# Patient Record
Sex: Male | Born: 1980 | Race: Black or African American | Hispanic: No | Marital: Single | Smoking: Current every day smoker
Health system: Southern US, Community
[De-identification: ages and names within clinical notes are randomized; demographics above are authoritative.]

---

## 2005-10-16 ENCOUNTER — Inpatient Hospital Stay: Payer: Self-pay | Admitting: Internal Medicine

## 2008-06-05 ENCOUNTER — Emergency Department: Payer: Self-pay | Admitting: Emergency Medicine

## 2008-06-06 ENCOUNTER — Emergency Department: Payer: Self-pay | Admitting: Emergency Medicine

## 2013-06-09 ENCOUNTER — Emergency Department: Payer: Self-pay | Admitting: Emergency Medicine
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
## 2016-05-01 ENCOUNTER — Emergency Department
Admission: EM | Admit: 2016-05-01 | Discharge: 2016-05-01 | Disposition: A | Discharge status: Home / Self Care | Payer: Self-pay | Attending: Student in an Organized Health Care Education/Training Program | Admitting: Student in an Organized Health Care Education/Training Program

## 2016-05-01 ENCOUNTER — Encounter: Payer: Self-pay | Admitting: Emergency Medicine

## 2016-05-01 DIAGNOSIS — F17210 Nicotine dependence, cigarettes, uncomplicated: Secondary | ICD-10-CM | POA: Insufficient documentation

## 2016-05-01 DIAGNOSIS — Y999 Unspecified external cause status: Secondary | ICD-10-CM | POA: Insufficient documentation

## 2016-05-01 DIAGNOSIS — Y9389 Activity, other specified: Secondary | ICD-10-CM | POA: Insufficient documentation

## 2016-05-01 DIAGNOSIS — Y9289 Other specified places as the place of occurrence of the external cause: Secondary | ICD-10-CM | POA: Insufficient documentation

## 2016-05-01 DIAGNOSIS — S0083XA Contusion of other part of head, initial encounter: Secondary | ICD-10-CM | POA: Insufficient documentation

## 2016-05-01 DIAGNOSIS — T148XXA Other injury of unspecified body region, initial encounter: Secondary | ICD-10-CM

## 2016-05-01 DIAGNOSIS — F1010 Alcohol abuse, uncomplicated: Secondary | ICD-10-CM | POA: Insufficient documentation
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
## 2016-05-01 NOTE — ED Notes (Signed)
Officers removed handcuffs to wrists for vital sign assessment. Remain off when patient transported to room.
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
## 2016-05-01 NOTE — Discharge Instructions (Signed)
Please seek medical care should he have worsening headache, pain, visual disturbance, nausea or vomiting.

## 2023-03-03 ENCOUNTER — Ambulatory Visit: Payer: 59 | Admitting: Physician Assistant

## 2023-03-08 ENCOUNTER — Ambulatory Visit (INDEPENDENT_AMBULATORY_CARE_PROVIDER_SITE_OTHER): Payer: 59 | Admitting: Family Medicine

## 2023-03-08 ENCOUNTER — Encounter: Payer: Self-pay | Admitting: Family Medicine

## 2023-03-08 VITALS — BP 137/77 | HR 85 | Ht 70.0 in | Wt 207.0 lb

## 2023-03-08 DIAGNOSIS — R030 Elevated blood-pressure reading, without diagnosis of hypertension: Secondary | ICD-10-CM

## 2023-03-08 DIAGNOSIS — F17200 Nicotine dependence, unspecified, uncomplicated: Secondary | ICD-10-CM | POA: Diagnosis not present

## 2023-03-08 DIAGNOSIS — Z7689 Persons encountering health services in other specified circumstances: Secondary | ICD-10-CM

## 2023-03-08 DIAGNOSIS — N528 Other male erectile dysfunction: Secondary | ICD-10-CM

## 2023-03-08 DIAGNOSIS — F419 Anxiety disorder, unspecified: Secondary | ICD-10-CM

## 2023-03-08 DIAGNOSIS — Z716 Tobacco abuse counseling: Secondary | ICD-10-CM

## 2023-03-08 DIAGNOSIS — Z0000 Encounter for general adult medical examination without abnormal findings: Secondary | ICD-10-CM

## 2023-03-08 MED ORDER — BUPROPION HCL ER (SR) 150 MG PO TB12
ORAL_TABLET | ORAL | 1 refills | Status: DC
Start: 2023-03-08 — End: 2023-05-23

## 2023-03-08 MED ORDER — NICOTINE 14 MG/24HR TD PT24
14.0000 mg | MEDICATED_PATCH | Freq: Every day | TRANSDERMAL | 0 refills | Status: AC
Start: 2023-03-08 — End: ?

## 2023-03-08 MED ORDER — NICOTINE POLACRILEX 4 MG MT GUM
4.0000 mg | CHEWING_GUM | OROMUCOSAL | 0 refills | Status: AC | PRN
Start: 2023-03-08 — End: ?

## 2023-03-08 NOTE — Progress Notes (Signed)
New patient visit   Patient: Nicholas Higgins   DOB: [birth year]   42 y.o. Male  MRN: [id number] Visit Date: 03/08/2023  Today's healthcare provider: [name], DO   Chief Complaint  Patient presents with   New Patient (Initial Visit)   Subjective    Nicholas Higgins is a 42 y.o. male who presents today as a new patient to establish care.  HPI   Patient states that he does not believe he has any concerns today.  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  - He states he does them as he believes it is easier than asking them to do it  Not drinking anymore. Still smoking; wants to stop.    History reviewed. No pertinent past medical history. History reviewed. No pertinent surgical history. Family Status  Relation Name Status   Mother  Alive   Father  Alive  No partnership data on file   Family History  Problem Relation Age of Onset   Hypertension Mother    Social History   Socioeconomic History   Marital status: Single    Spouse name: Not on file   Number of children: Not on file   Years of education: Not on file   Highest education level: Not on file  Occupational History   Not on file  Tobacco Use   Smoking status: Every Day    Current packs/day: 0.50    Types: Cigarettes   Smokeless tobacco: Never  Substance and Sexual Activity   Alcohol use: Not Currently   Drug use: Not Currently   Sexual activity: Not on file  Other Topics Concern   Not on file  Social History Narrative   Not on file   Social Determinants of Health   Financial Resource Strain: Not on file  Food Insecurity: Not on file  Transportation Needs: Not on file   Physical Activity: Not on file  Stress: Not on file  Social Connections: Not on file   No outpatient medications prior to visit.   No facility-administered medications prior to visit.   No Known Allergies   There is no immunization history on file for this patient.  Health Maintenance  Topic Date Due   HIV Screening  Never done   Hepatitis C Screening  Never done   DTaP/Tdap/Td (1 - Tdap) Never done   COVID-19 Vaccine (1 - 2023-24 season) Never done   INFLUENZA VACCINE  03/30/2023   HPV VACCINES  Aged Out    Patient Care Team: Imanie Darrow, Monico Blitz, DO as PCP - General (Family Medicine)  Review of Systems  Constitutional:  Negative for appetite change, chills, fatigue and fever.  HENT:  Negative for congestion, ear pain, hearing loss, nosebleeds, sore throat and trouble swallowing.   Eyes:  Negative for pain and visual disturbance.  Respiratory:  Negative for cough, chest tightness and shortness of breath.   Cardiovascular:  Negative for chest pain, palpitations and leg swelling.  Gastrointestinal:  Negative for abdominal pain, blood in stool, constipation, diarrhea, nausea and vomiting.  Endocrine: Negative for polydipsia, polyphagia and polyuria.  Genitourinary:  Negative for difficulty urinating, dysuria, flank pain,  frequency, hematuria and urgency.  Musculoskeletal:  Negative for arthralgias, back pain, joint swelling, myalgias and neck stiffness.  Skin:  Negative for color change, rash and wound.  Neurological:  Negative for dizziness, tremors, seizures, speech difficulty, weakness, light-headedness, numbness and headaches.  Psychiatric/Behavioral:  Negative for behavioral problems, confusion, decreased concentration, dysphoric mood and sleep disturbance. The patient is nervous/anxious.   All other systems reviewed and are negative.      Objective    BP 137/77   Pulse 85   Ht 5\' 10"  (1.778 m)   Wt 207 lb (93.9 kg)   SpO2 97%   BMI 29.70 kg/m    Physical  Exam Vitals reviewed.  Constitutional:      General: He is not in acute distress.    Appearance: Normal appearance. He is well-developed. He is not diaphoretic.  HENT:     Head: Normocephalic and atraumatic.     Right Ear: Tympanic membrane, ear canal and external ear normal.     Left Ear: Tympanic membrane, ear canal and external ear normal.     Nose: Nose normal.     Mouth/Throat:     Mouth: Mucous membranes are moist.     Pharynx: Oropharynx is clear. No oropharyngeal exudate.  Eyes:     General: No scleral icterus.    Conjunctiva/sclera: Conjunctivae normal.     Pupils: Pupils are equal, round, and reactive to light.  Neck:     Thyroid: No thyromegaly.  Cardiovascular:     Rate and Rhythm: Normal rate and regular rhythm.     Pulses: Normal pulses.     Heart sounds: Normal heart sounds. No murmur heard. Pulmonary:     Effort: Pulmonary effort is normal. No respiratory distress.     Breath sounds: Normal breath sounds. No wheezing or rales.  Abdominal:     General: There is no distension.     Palpations: Abdomen is soft.     Tenderness: There is no abdominal tenderness.  Musculoskeletal:        General: No deformity.     Cervical back: Neck supple.     Right lower leg: No edema.     Left lower leg: No edema.  Lymphadenopathy:     Cervical: No cervical adenopathy.  Skin:    General: Skin is warm and dry.     Findings: No rash.  Neurological:     Mental Status: He is alert and oriented to person, place, and time. Mental status is at baseline.     Gait: Gait normal.  Psychiatric:        Mood and Affect: Mood normal.        Behavior: Behavior normal.        Thought Content: Thought content normal. Thought content does not include homicidal or suicidal ideation. Thought content does not include homicidal or suicidal plan.     Depression Screen    03/08/2023    1:35 PM  PHQ 2/9 Scores  PHQ - 2 Score 0  PHQ- 9 Score 0   No results found for any visits on  03/08/23.  Assessment & Plan     1. Establishing care with new doctor, encounter for  2. Annual physical exam Physical exam benign overall.  Patient will be getting labs done while fasting on a different day.  Will also screen for HIV/HCV as he has never been screened before to his knowledge. - Comprehensive metabolic panel - Lipid panel - HIV Antibody (routine testing w rflx) -  HCV Ab w Reflex to Quant PCR - CBC With Differential - TSH Rfx on Abnormal to Free T4  3. Nicotine dependence with current use Patient does want to stop smoking.  Will be prescribing bupropion as well as a moderate dose patch as noted below.  Also prescribed Nicorette gum for patient to use when he gets an inclination to smoke.  Cautioned him against using too many along with the Nicorette patch, as it would potentially increase his nicotine dose if he does so.  Also discussed that the gum is not a true gum but that it needs to be chewed and then put next to his gums and held there rather than consistently chewed.  Referred to smoking cessation program.  Also gave him 1 800 quit now hotline number to facilitate access to alternative smoking cessation programs. - Ambulatory referral to Smoking Cessation Program - nicotine (NICODERM CQ - DOSED IN MG/24 HOURS) 14 mg/24hr patch; Place 1 patch (14 mg total) onto the skin daily.  Dispense: 28 patch; Refill: 0 - nicotine polacrilex (NICORETTE) 4 MG gum; Take 1 each (4 mg total) by mouth as needed for smoking cessation.  Dispense: 100 tablet; Refill: 0 - buPROPion (WELLBUTRIN SR) 150 MG 12 hr tablet; 150 mg orally once daily in the morning for 3 days, then increase to 150 mg orally twice daily, with at least 8 hours between doses; MAX, 300 mg/day  Dispense: 60 tablet; Refill: 1  4. Encounter for smoking cessation counseling - Ambulatory referral to Smoking Cessation Program - buPROPion (WELLBUTRIN SR) 150 MG 12 hr tablet; 150 mg orally once daily in the morning for 3 days,  then increase to 150 mg orally twice daily, with at least 8 hours between doses; MAX, 300 mg/day  Dispense: 60 tablet; Refill: 1  5. Elevated blood pressure reading without diagnosis of hypertension Patient has a mildly elevated blood pressure without a previous diagnosis of hypertension.  Patient states he is able to check his blood pressure at his mother's home nearby and will do so and bring a log on his next visit.  Will plan to recheck on follow-up.  6. Moderate anxiety GAD-7 score: 10.  Discussed options for addressing the patient's anxiety.  Patient is willing to try bupropion as listed below to address both his anxiety and his desire to quit smoking.  Will plan to have him follow-up in 4 weeks for recheck.  He is not willing to try therapy at this time. - buPROPion (WELLBUTRIN SR) 150 MG 12 hr tablet; 150 mg orally once daily in the morning for 3 days, then increase to 150 mg orally twice daily, with at least 8 hours between doses; MAX, 300 mg/day  Dispense: 60 tablet; Refill: 1  7. Mixed erectile dysfunction Patient does endorse issues with erectile dysfunction and scored low in the orgasmic function and sexual desire domains of the International index of erectile function questionnaire.  Will check androgen, prolactin and testosterone levels as noted below, as well as refer him to urology IIEF-15 domain scores: A. 16; B. 6; C. 5; D. 10; E. 4 - Androstenedione - Prolactin - Testosterone,Free and Total - Ambulatory referral to Urology    Return in about 4 weeks (around 04/05/2023) for F/u stopping smoking.     I discussed the assessment and treatment plan with the patient  The patient was provided an opportunity to ask questions and all were answered. The patient agreed with the plan and demonstrated an understanding of the instructions.  The patient was advised to call back or seek an in-person evaluation if the symptoms worsen or if the condition fails to improve as  anticipated.  Total time was 40 minutes. That includes chart review before the visit, the actual patient visit, and time spent on documentation after the visit.  This does not include time spent on the physical exam.    [name], DO  [facility] [phone] (phone) [phone] (fax)  [facility]

## 2023-03-08 NOTE — Patient Instructions (Addendum)
1-800-QUIT-NOW [phone])   Please check to see if you have had a tetanus shot in the last 10 years.  Please bring the record if you can find it.  Check your blood pressure at least every couple of days.  Make sure you are sitting for at least 5 minutes with your feet flat on the floor and your arm at heart level and write them down.  - Please bring your log with you to your next visit.

## 2023-03-31 ENCOUNTER — Ambulatory Visit: Payer: 59 | Admitting: Urology

## 2023-03-31 VITALS — BP 124/82 | HR 82 | Ht 69.0 in | Wt 210.0 lb

## 2023-03-31 DIAGNOSIS — N529 Male erectile dysfunction, unspecified: Secondary | ICD-10-CM

## 2023-03-31 MED ORDER — TADALAFIL 20 MG PO TABS: ORAL_TABLET | ORAL | 0 refills | Status: DC

## 2023-03-31 NOTE — Progress Notes (Signed)
I, Nicholas Higgins, acting as a scribe for Nicholas Altes, MD., have documented all relevant documentation on the behalf of Nicholas Altes, MD, as directed by Nicholas Altes, MD while in the presence of Nicholas Altes, MD.  03/31/2023 10:02 AM   Nicholas Higgins 12-Apr-1981 [id number]  Referring provider: [name], DO [address] [place],  [place] [address]  Chief Complaint  Patient presents with   Erectile Dysfunction    HPI: Nicholas Higgins is a 42 y.o. male referred for evaluation of erectile dysfunction.   2 year history of difficulty achieving and maintaining an erection. Sexual health inventory for men's score was 14/25, indicating mild to moderate ED. No previous treatments.  Organic risk factors: tobacco use one and a half pack per day for the last 15 years.  No diabetes or documented hypertension, though BP was elevated at his initial PCP evaluation.  A testosterone level was low normal at 324 ng/dL. Free testosterone normal at 11.7 Prolactin level mildly elevated at 27.9. He denies decreased libido, notes occasional tiredness or fatigue.   Home Medications:  Allergies as of 03/31/2023   No Known Allergies      Medication List        Accurate as of March 31, 2023 10:02 AM. If you have any questions, ask your nurse or doctor.          buPROPion 150 MG 12 hr tablet Commonly known as: Wellbutrin SR 150 mg orally once daily in the morning for 3 days, then increase to 150 mg orally twice daily, with at least 8 hours between doses; MAX, 300 mg/day   nicotine 14 mg/24hr patch Commonly known as: NICODERM CQ - dosed in mg/24 hours Place 1 patch (14 mg total) onto the skin daily.   nicotine polacrilex 4 MG gum Commonly known as: Nicorette Take 1 each (4 mg total) by mouth as needed for smoking cessation.   tadalafil 20 MG tablet Commonly known as: CIALIS Take 1 tablet 1 hr prior to intercourse        Allergies: No Known Allergies  Family  History: Family History  Problem Relation Age of Onset   Hypertension Mother     Social History:  reports that he has been smoking cigarettes. He has never used smokeless tobacco. He reports that he does not currently use alcohol. He reports that he does not currently use drugs.   Physical Exam: BP 124/82   Pulse 82   Ht 5\' 9"  (1.753 m)   Wt 210 lb (95.3 kg)   BMI 31.01 kg/m   Constitutional:  Alert and oriented, No acute distress. HEENT: [place] AT, moist mucus membranes.  Trachea midline, no masses. Cardiovascular: No clubbing, cyanosis, or edema. Respiratory: Normal respiratory effort, no increased work of breathing. GI: Abdomen is soft, nontender, nondistended, no abdominal masses GU: Phallus without lesions, testes descended bilaterally without masses or tenderness. Estimated volume 20 cc's bilaterally.  Skin: No rashes, bruises or suspicious lesions. Neurologic: Grossly intact, no focal deficits, moving all 4 extremities. Psychiatric: Normal mood and affect.   Assessment & Plan:    1. Erectile dysfunction We discussed the majority of organic erectal dysfunction is related to vascular abnormalities and not hormonal.  His testosterone level was low normal and based on the AUA guidelines, treatment would not be recommended.  He was entered into trial of PDE5 inhibitor medication and Rx tadalafil was sent to pharmacy PA follow-up 1 month for recheck and will  schedule a lab visit for a repeat testosterone level, prolactin, and LH prior to that appointment PCP indicated she was referring to endocrinology for his elevated prolactin level.  If prolactin remains elevated, would definitely recommend endocrinology evaluation.  [facility] [address] [place], [place] [address] [phone]

## 2023-04-02 ENCOUNTER — Encounter: Payer: Self-pay | Admitting: Urology

## 2023-04-13 ENCOUNTER — Encounter: Payer: Self-pay | Admitting: Family Medicine

## 2023-04-13 ENCOUNTER — Ambulatory Visit (INDEPENDENT_AMBULATORY_CARE_PROVIDER_SITE_OTHER): Payer: 59 | Admitting: Family Medicine

## 2023-04-13 VITALS — BP 124/71 | HR 84 | Temp 98.7°F | Wt 216.0 lb

## 2023-04-13 DIAGNOSIS — F17200 Nicotine dependence, unspecified, uncomplicated: Secondary | ICD-10-CM

## 2023-04-13 DIAGNOSIS — N528 Other male erectile dysfunction: Secondary | ICD-10-CM | POA: Diagnosis not present

## 2023-04-13 NOTE — Assessment & Plan Note (Addendum)
Will order follow-up testosterone, luteinizing hormone and prolactin levels for patient to have drawn later this month, results will be available in time for his urology appointment as well..  If prolactin is still elevated at that time, I will refer him to endocrinology.

## 2023-04-13 NOTE — Progress Notes (Signed)
Established patient visit   Patient: Nicholas Higgins   DOB: 04/11/1981   42 y.o. Male  MRN: [id number] Visit Date: 04/13/2023  Today's healthcare provider: [name], DO   Chief Complaint  Patient presents with   Hypertension    Patient does not have a record of his home readings but said he remembered a reading from about 1 week ago being 120's over 80's.  He has no complaints today.   Nicotine Dependence    Patient states he never used the gum he is just using a Nicotine patch.  He reports some days doing better than other in that he is still smoking some.   Subjective    HPI HPI     Hypertension    Additional comments: Patient does not have a record of his home readings but said he remembered a reading from about 1 week ago being 120's over 80's.  He has no complaints today.        Nicotine Dependence    Additional comments: Patient states he never used the gum he is just using a Nicotine patch.  He reports some days doing better than other in that he is still smoking some.      Last edited by Adline Peals, CMA on 04/13/2023  9:15 AM.     Now smoking up to 4-5 cigarettes in a day most days, down from a half pack a day.  - has not had the nicotine gum because it is being ordering.   Erectile dysfunction: - He has had mild improvement on the tadalafil at this point. - Elevated prolactin:  Patient denies using any form of marijuana at this time.  He denies any passive exposure to marijuana smoke.  He does have a friend to smokes marijuana but notes that she does so away from him (outside).  He denies any other concerns he would like to address today.     Medications: Outpatient Medications Prior to Visit  Medication Sig   buPROPion (WELLBUTRIN SR) 150 MG 12 hr tablet 150 mg orally once daily in the morning for 3 days, then increase to 150 mg orally twice daily, with at least 8 hours between doses; MAX, 300 mg/day   nicotine (NICODERM CQ - DOSED IN MG/24  HOURS) 14 mg/24hr patch Place 1 patch (14 mg total) onto the skin daily.   tadalafil (CIALIS) 20 MG tablet Take 1 tablet 1 hr prior to intercourse   nicotine polacrilex (NICORETTE) 4 MG gum Take 1 each (4 mg total) by mouth as needed for smoking cessation. (Patient not taking: Reported on 04/13/2023)   No facility-administered medications prior to visit.    Review of Systems  Constitutional:  Negative for chills, fatigue and fever.  HENT:  Negative for congestion and rhinorrhea.   Eyes:  Negative for itching and visual disturbance.  Respiratory: Negative.  Negative for cough, shortness of breath and wheezing.   Cardiovascular:  Negative for chest pain, palpitations and leg swelling.  Gastrointestinal:  Negative for abdominal pain.  Genitourinary:  Negative for difficulty urinating, dysuria, hematuria, penile pain, penile swelling, scrotal swelling and testicular pain.  Musculoskeletal:  Negative for arthralgias and back pain.  Neurological:  Negative for weakness and headaches.  Psychiatric/Behavioral:  The patient is not nervous/anxious.         Objective    BP 124/71 (BP Location: Right Arm, Patient Position: Sitting, Cuff Size: Large)   Pulse 84   Temp 98.7 F (37.1 C) (  Oral)   Wt 216 lb (98 kg)   SpO2 97%   BMI 31.90 kg/m     Physical Exam Vitals and nursing note reviewed.  Constitutional:      General: He is not in acute distress.    Appearance: Normal appearance.  HENT:     Head: Normocephalic and atraumatic.  Eyes:     General: No scleral icterus.    Conjunctiva/sclera: Conjunctivae normal.  Cardiovascular:     Rate and Rhythm: Normal rate.  Pulmonary:     Effort: Pulmonary effort is normal.  Neurological:     Mental Status: He is alert and oriented to person, place, and time. Mental status is at baseline.  Psychiatric:        Mood and Affect: Mood normal.        Behavior: Behavior normal.      No results found for any visits on 04/13/23.  Assessment &  Plan    Nicotine dependence with current use Assessment & Plan: Patient doing well with reducing his use of cigarettes. He has not been able to get the nicotine gum but will be following up to see if the order has come in yet.   Mixed erectile dysfunction Assessment & Plan: Will order follow-up testosterone, luteinizing hormone and prolactin levels for patient to have drawn later this month.  If prolactin is still elevated at that time, I will refer him to endocrinology.  Orders: -     Testosterone,Free and Total -     Luteinizing hormone -     Prolactin    Return in about 3 months (around 07/14/2023) for f/u smoking cessation.      I discussed the assessment and treatment plan with the patient  The patient was provided an opportunity to ask questions and all were answered. The patient agreed with the plan and demonstrated an understanding of the instructions.   The patient was advised to call back or seek an in-person evaluation if the symptoms worsen or if the condition fails to improve as anticipated.    [name], DO  [facility] [phone] (phone) [phone] (fax)  [facility]

## 2023-04-13 NOTE — Assessment & Plan Note (Signed)
Patient doing well with reducing his use of cigarettes. He has not been able to get the nicotine gum but will be following up to see if the order has come in yet.

## 2023-04-28 ENCOUNTER — Encounter: Payer: Self-pay | Admitting: Family Medicine

## 2023-05-02 ENCOUNTER — Other Ambulatory Visit: Payer: Self-pay

## 2023-05-02 ENCOUNTER — Other Ambulatory Visit: Payer: 59

## 2023-05-02 DIAGNOSIS — N529 Male erectile dysfunction, unspecified: Secondary | ICD-10-CM

## 2023-05-02 NOTE — Addendum Note (Signed)
Addended by: Consuella Lose on: 05/02/2023 08:52 AM   Modules accepted: Orders

## 2023-05-02 NOTE — Progress Notes (Signed)
05/03/2023 10:37 AM   [name] 1980/12/25 [id number]  Referring provider: [name], DO [address] [place],  [place] [address]  Urological history: 1.  Erectile dysfunction -Contributing factors of tobacco use -tadalafil 20 mg, on-demand-dosing  Chief Complaint  Patient presents with   Follow-up   HPI: Nicholas Higgins is a 42 y.o. male who presents today for follow up.    Previous records reviewed.   SHIM 10  Morning testosterone level (03/2023) 323, prolactin 14.4 and LH 7.1.  Patient still having spontaneous erections.  He denies any pain or curvature with erections.    He had good success with the tadalafil 20 mg on-demand dosing.  He states that it worked better the next day.  He is not interested in starting testosterone therapy at this time.   SHIM     Row Name 05/03/23 1011         SHIM: Over the last 6 months:   How do you rate your confidence that you could get and keep an erection? Low     When you had erections with sexual stimulation, how often were your erections hard enough for penetration (entering your partner)? A Few Times (much less than half the time)     During sexual intercourse, how often were you able to maintain your erection after you had penetrated (entered) your partner? Sometimes (about half the time)     During sexual intercourse, how difficult was it to maintain your erection to completion of intercourse? Very Difficult     When you attempted sexual intercourse, how often was it satisfactory for you? Almost Never or Never       SHIM Total Score   SHIM 10              Score: 1-7 Severe ED 8-11 Moderate ED 12-16 Mild-Moderate ED 17-21 Mild ED 22-25 No ED   PMH: No past medical history on file.  Surgical History: No past surgical history on file.  Home Medications:  Allergies as of 05/03/2023   No Known Allergies      Medication List        Accurate as of May 03, 2023 10:37 AM. If you have  any questions, ask your nurse or doctor.          buPROPion 150 MG 12 hr tablet Commonly known as: Wellbutrin SR 150 mg orally once daily in the morning for 3 days, then increase to 150 mg orally twice daily, with at least 8 hours between doses; MAX, 300 mg/day   nicotine 14 mg/24hr patch Commonly known as: NICODERM CQ - dosed in mg/24 hours Place 1 patch (14 mg total) onto the skin daily.   nicotine polacrilex 4 MG gum Commonly known as: Nicorette Take 1 each (4 mg total) by mouth as needed for smoking cessation.   tadalafil 20 MG tablet Commonly known as: CIALIS Take 1 tablet 1 hr prior to intercourse What changed: Another medication with the same name was added. Make sure you understand how and when to take each.   tadalafil 20 MG tablet Commonly known as: CIALIS Take 1 tablet (20 mg total) by mouth daily as needed for erectile dysfunction. What changed: You were already taking a medication with the same name, and this prescription was added. Make sure you understand how and when to take each.        Allergies: No Known Allergies  Family History: Family History  Problem Relation Age of Onset  Hypertension Mother     Social History:  reports that he has been smoking cigarettes. He has never used smokeless tobacco. He reports that he does not currently use alcohol. He reports that he does not currently use drugs.  ROS: Pertinent ROS in HPI  Physical Exam: BP (!) 147/91   Pulse 75   Ht 5\' 9"  (1.753 m)   Wt 216 lb (98 kg)   BMI 31.90 kg/m   Constitutional:  Well nourished. Alert and oriented, No acute distress. HEENT: [place] AT, moist mucus membranes.  Trachea midline Cardiovascular: No clubbing, cyanosis, or edema. Respiratory: Normal respiratory effort, no increased work of breathing. Neurologic: Grossly intact, no focal deficits, moving all 4 extremities. Psychiatric: Normal mood and affect.  Laboratory Data: Lab Results  Component Value Date   CREATININE  0.97 03/27/2023    Lab Results  Component Value Date   TESTOSTERONE 323 05/02/2023    Lab Results  Component Value Date   TSH 2.110 03/27/2023       Component Value Date/Time   CHOL 156 03/27/2023 0816   HDL 71 03/27/2023 0816   CHOLHDL 2.2 03/27/2023 0816   LDLCALC 72 03/27/2023 0816    Lab Results  Component Value Date   AST 20 03/27/2023   Lab Results  Component Value Date   ALT 18 03/27/2023  I have reviewed the labs.   Pertinent Imaging: N/A  Assessment & Plan:    1. Erectile dysfunction - I explained to the patient that in order to achieve an erection it takes good functioning of the nervous system (parasympathetic and rs, sympathetic, sensory and motor), good blood flow into the erectile tissue of the penis and a desire to have sex - I explained that conditions like diabetes, hypertension, coronary artery disease, peripheral vascular disease, smoking, alcohol consumption, age, sleep apnea and BPH can diminish the ability to have an erection - serum testosterone level within normal limits - A recent study published in Sex Med 2018 Apr 13 revealed moderate to vigorous aerobic exercise for 40 minutes 4 times per week can decrease erectile problems caused by physical inactivity, obesity, hypertension, metabolic syndrome and/or cardiovascular diseases   Return in about 1 year (around 05/02/2024) for SHIM .  These notes generated with voice recognition software. I apologize for typographical errors.  Cloretta Ned  [facility] [address]  [address] [place], [place] [address] [phone]

## 2023-05-03 ENCOUNTER — Encounter: Payer: Self-pay | Admitting: Urology

## 2023-05-03 ENCOUNTER — Ambulatory Visit (INDEPENDENT_AMBULATORY_CARE_PROVIDER_SITE_OTHER): Payer: 59 | Admitting: Urology

## 2023-05-03 VITALS — BP 147/91 | HR 75 | Ht 69.0 in | Wt 216.0 lb

## 2023-05-03 DIAGNOSIS — N529 Male erectile dysfunction, unspecified: Secondary | ICD-10-CM

## 2023-05-03 LAB — PROLACTIN: Prolactin: 14.4 ng/mL (ref 3.9–22.7)

## 2023-05-03 LAB — TESTOSTERONE: Testosterone: 323 ng/dL (ref 264–916)

## 2023-05-03 LAB — LUTEINIZING HORMONE: LH: 7.1 m[IU]/mL (ref 1.7–8.6)

## 2023-05-03 MED ORDER — TADALAFIL 20 MG PO TABS
20.0000 mg | ORAL_TABLET | Freq: Every day | ORAL | 3 refills | Status: AC | PRN
Start: 2023-05-03 — End: ?

## 2023-05-22 ENCOUNTER — Other Ambulatory Visit: Payer: Self-pay | Admitting: Family Medicine

## 2023-05-22 DIAGNOSIS — F419 Anxiety disorder, unspecified: Secondary | ICD-10-CM

## 2023-05-22 DIAGNOSIS — Z716 Tobacco abuse counseling: Secondary | ICD-10-CM

## 2023-05-22 DIAGNOSIS — F17200 Nicotine dependence, unspecified, uncomplicated: Secondary | ICD-10-CM

## 2023-07-14 ENCOUNTER — Ambulatory Visit: Payer: 59 | Admitting: Family Medicine

## 2023-07-17 ENCOUNTER — Ambulatory Visit (INDEPENDENT_AMBULATORY_CARE_PROVIDER_SITE_OTHER): Payer: 59 | Admitting: Family Medicine

## 2023-07-17 ENCOUNTER — Encounter: Payer: Self-pay | Admitting: Family Medicine

## 2023-07-17 VITALS — BP 126/73 | HR 86 | Resp 16 | Ht 69.0 in | Wt 223.3 lb

## 2023-07-17 DIAGNOSIS — F17200 Nicotine dependence, unspecified, uncomplicated: Secondary | ICD-10-CM | POA: Diagnosis not present

## 2023-07-17 DIAGNOSIS — Z23 Encounter for immunization: Secondary | ICD-10-CM | POA: Diagnosis not present

## 2023-07-17 DIAGNOSIS — F419 Anxiety disorder, unspecified: Secondary | ICD-10-CM

## 2023-07-17 DIAGNOSIS — Z716 Tobacco abuse counseling: Secondary | ICD-10-CM | POA: Diagnosis not present

## 2023-07-17 MED ORDER — BUPROPION HCL ER (SR) 150 MG PO TB12
150.0000 mg | ORAL_TABLET | Freq: Two times a day (BID) | ORAL | 2 refills | Status: AC
Start: 2023-07-17 — End: ?

## 2023-07-17 NOTE — Assessment & Plan Note (Addendum)
Progress in smoking cessation, down from 2 packs per day to an average of 4-5 cigarettes per day. Tolerating Bupropion. Discontinued nicotine patches due to adverse effects and did not tolerate nicotine gum. Patient declined nicotine lozenges and prefers to continue self-directed cessation efforts. -Continue Bupropion. -Encourage continued self-directed smoking cessation efforts. -Will send nicotine lozenges if patient requests prior to next appointment.

## 2023-07-17 NOTE — Progress Notes (Signed)
Established patient visit   Patient: Nicholas Higgins   DOB: 01-21-1981   42 y.o. Male  MRN: [id number] Visit Date: 07/17/2023  Today's healthcare provider: [name], DO   Chief Complaint  Patient presents with   Smoking Cessation Screening    He has went from a pack every 2 days to 4-5 cigarettes  a day. Some days without.    Subjective    HPI Last visit 04/13/2023  The patient, with a history of tobacco use, has been making efforts to reduce his smoking habit. He reports a decrease from two packs per day to an average of four to five cigarettes daily, although this varies with stress levels. He has been using bupropion and nicotine patches to aid in smoking cessation, but reports a "funny feeling" with the patches and has discontinued his use. He has also tried nicotine gum, but found it unpalatable and has stopped using it.  In addition to his smoking cessation efforts, the patient has been managing his anxiety, which he reports as being "pretty fine." He has also been taking Cialis, which he reports as effective. He has an upcoming appointment with urology.  The patient has been proactive in maintaining his health, ensuring hydration and avoiding individuals who are ill. He reports no current pain, congestion, itchy eyes, sore throat, nausea, vomiting, diarrhea, or constipation. His sleep has been satisfactory.  The patient's goal is to quit smoking by the new year and he expresses confidence in achieving this. He is open to trying nicotine lozenges in the future if needed.     Medications: Outpatient Medications Prior to Visit  Medication Sig   tadalafil (CIALIS) 20 MG tablet Take 1 tablet 1 hr prior to intercourse   tadalafil (CIALIS) 20 MG tablet Take 1 tablet (20 mg total) by mouth daily as needed for erectile dysfunction.   [DISCONTINUED] buPROPion (WELLBUTRIN SR) 150 MG 12 hr tablet TAKE 1 TABLET BY MOUTH IN THE MORNING FOR 3 DAYS THEN 1 TWICE DAILY WITH  AT  LEAST   8  HOURS  BETWEEN  DOSES.  MAX  OF  300  MG  PER  DAY   nicotine (NICODERM CQ - DOSED IN MG/24 HOURS) 14 mg/24hr patch Place 1 patch (14 mg total) onto the skin daily. (Patient not taking: Reported on 07/17/2023)   nicotine polacrilex (NICORETTE) 4 MG gum Take 1 each (4 mg total) by mouth as needed for smoking cessation. (Patient not taking: Reported on 04/13/2023)   No facility-administered medications prior to visit.    Review of Systems  Constitutional:  Negative for chills, fatigue and fever.  HENT:  Negative for congestion, ear pain and sore throat.   Eyes:  Negative for itching.  Respiratory: Negative.  Negative for cough, shortness of breath and wheezing.   Cardiovascular:  Negative for chest pain, palpitations and leg swelling.  Gastrointestinal:  Negative for abdominal pain, constipation, diarrhea, nausea and vomiting.  Musculoskeletal:  Negative for arthralgias and back pain.  Neurological:  Negative for weakness and headaches.  Psychiatric/Behavioral:  Negative for sleep disturbance. The patient is not nervous/anxious.         Objective    BP 126/73   Pulse 86   Resp 16   Ht 5\' 9"  (1.753 m)   Wt 223 lb 4.8 oz (101.3 kg)   SpO2 100%   BMI 32.98 kg/m     Physical Exam Vitals and nursing note reviewed.  Constitutional:  General: He is not in acute distress.    Appearance: Normal appearance.  HENT:     Head: Normocephalic and atraumatic.  Eyes:     General: No scleral icterus.    Conjunctiva/sclera: Conjunctivae normal.  Cardiovascular:     Rate and Rhythm: Normal rate.  Pulmonary:     Effort: Pulmonary effort is normal.  Neurological:     Mental Status: He is alert and oriented to person, place, and time. Mental status is at baseline.  Psychiatric:        Mood and Affect: Mood normal.        Behavior: Behavior normal.      No results found for any visits on 07/17/23.  Assessment & Plan    Encounter for smoking cessation counseling -      buPROPion HCl ER (SR); Take 1 tablet (150 mg total) by mouth 2 (two) times daily. Take doses at least 8  hours between doses.  Max:  300 mg per day  Dispense: 60 tablet; Refill: 2  Nicotine dependence with current use Assessment & Plan: Declines nicotine lozenges at this time. Will send nicotine lozenges if patient requests prior to next appointment.  Orders: -     buPROPion HCl ER (SR); Take 1 tablet (150 mg total) by mouth 2 (two) times daily. Take doses at least 8  hours between doses.  Max:  300 mg per day  Dispense: 60 tablet; Refill: 2  Need for Tdap vaccination  Moderate anxiety -     buPROPion HCl ER (SR); Take 1 tablet (150 mg total) by mouth 2 (two) times daily. Take doses at least 8  hours between doses.  Max:  300 mg per day  Dispense: 60 tablet; Refill: 2  General Health Maintenance Received influenza vaccine in September 2024. Declined COVID-19 booster. -Administer Tdap vaccine today.   Return in about 8 months (around 03/07/2024), or for follow-up on smoking cessation, for CPE; last CPE 03/08/2023.      I discussed the assessment and treatment plan with the patient  The patient was provided an opportunity to ask questions and all were answered. The patient agreed with the plan and demonstrated an understanding of the instructions.   The patient was advised to call back or seek an in-person evaluation if the symptoms worsen or if the condition fails to improve as anticipated.    [name], DO  [facility] [phone] (phone) [phone] (fax)  [facility]

## 2023-07-30 ENCOUNTER — Other Ambulatory Visit: Payer: Self-pay | Admitting: Urology

## 2023-07-31 MED ORDER — TADALAFIL 20 MG PO TABS: ORAL_TABLET | ORAL | 0 refills | Status: DC

## 2023-08-01 ENCOUNTER — Other Ambulatory Visit: Payer: Self-pay | Admitting: Urology

## 2023-10-15 ENCOUNTER — Other Ambulatory Visit: Payer: Self-pay | Admitting: Urology

## 2023-10-16 MED ORDER — TADALAFIL 20 MG PO TABS: ORAL_TABLET | ORAL | 3 refills | Status: DC

## 2024-03-04 ENCOUNTER — Other Ambulatory Visit: Payer: Self-pay | Admitting: Urology

## 2024-03-15 ENCOUNTER — Ambulatory Visit: Payer: Self-pay | Admitting: Family Medicine

## 2024-04-09 ENCOUNTER — Encounter: Payer: Self-pay | Admitting: Family Medicine

## 2024-04-30 NOTE — Progress Notes (Deleted)
 05/01/2024 8:55 PM   Nicholas Higgins [birth year] [id number]  Referring provider: Donzella Lauraine SAILOR, DO [address] [place],  [place] [address]  Urological history: 1.  Erectile dysfunction -Contributing factors of tobacco use -tadalafil  20 mg, on-demand-dosing  No chief complaint on file.  HPI: Nicholas Higgins is a 42 y.o. male who presents today for follow up.    Previous records reviewed.    PMH: No past medical history on file.  Surgical History: No past surgical history on file.  Home Medications:  Allergies as of 05/01/2024   No Known Allergies      Medication List        Accurate as of April 30, 2024  8:55 PM. If you have any questions, ask your nurse or doctor.          buPROPion  150 MG 12 hr tablet Commonly known as: WELLBUTRIN  SR Take 1 tablet (150 mg total) by mouth 2 (two) times daily. Take doses at least 8  hours between doses.  Max:  300 mg per day   nicotine  14 mg/24hr patch Commonly known as: NICODERM CQ  - dosed in mg/24 hours Place 1 patch (14 mg total) onto the skin daily.   nicotine  polacrilex 4 MG gum Commonly known as: Nicorette  Take 1 each (4 mg total) by mouth as needed for smoking cessation.   tadalafil  20 MG tablet Commonly known as: CIALIS  Take 1 tablet (20 mg total) by mouth daily as needed for erectile dysfunction.   tadalafil  20 MG tablet Commonly known as: CIALIS  TAKE 1 TABLET 1 HR PRIOR TO INTERCOURSE        Allergies: No Known Allergies  Family History: Family History  Problem Relation Age of Onset   Hypertension Mother     Social History:  reports that he has been smoking cigarettes. He has never used smokeless tobacco. He reports that he does not currently use alcohol. He reports that he does not currently use drugs.  ROS: Pertinent ROS in HPI  Physical Exam: There were no vitals taken for this visit.  Constitutional:  Well nourished. Alert and oriented, No acute distress. HEENT: [place] AT, moist  mucus membranes.  Trachea midline, no masses. Cardiovascular: No clubbing, cyanosis, or edema. Respiratory: Normal respiratory effort, no increased work of breathing. GI: Abdomen is soft, non tender, non distended, no abdominal masses. Liver and spleen not palpable.  No hernias appreciated.  Stool sample for occult testing is not indicated.   GU: No CVA tenderness.  No bladder fullness or masses.  Patient with circumcised/uncircumcised phallus. ***Foreskin easily retracted***  Urethral meatus is patent.  No penile discharge. No penile lesions or rashes. Scrotum without lesions, cysts, rashes and/or edema.  Testicles are located scrotally bilaterally. No masses are appreciated in the testicles. Left and right epididymis are normal. Rectal: Patient with  normal sphincter tone. Anus and perineum without scarring or rashes. No rectal masses are appreciated. Prostate is approximately *** grams, *** nodules are appreciated. Seminal vesicles are normal. Skin: No rashes, bruises or suspicious lesions. Lymph: No cervical or inguinal adenopathy. Neurologic: Grossly intact, no focal deficits, moving all 4 extremities. Psychiatric: Normal mood and affect.   Laboratory Data See EPIC and HPI I have reviewed the labs.   Pertinent Imaging: N/A  Assessment & Plan:    1. Erectile dysfunction - Check testosterone  level per AUA guidelines -Continue tadalafil  20 mg, on-demand dosing  No follow-ups on file.  These notes generated with voice recognition software. I apologize for  typographical errors.  CLOTILDA HELON RIGGERS  [facility] [address]  [address] [place], [place] [address] [phone]

## 2024-05-01 ENCOUNTER — Ambulatory Visit: Payer: Self-pay | Admitting: Urology

## 2024-05-01 DIAGNOSIS — N529 Male erectile dysfunction, unspecified: Secondary | ICD-10-CM

## 2024-08-06 ENCOUNTER — Other Ambulatory Visit: Payer: Self-pay | Admitting: Urology
